# Patient Record
Sex: Female | Born: 1985 | Race: White | Hispanic: No | Marital: Married | Smoking: Current some day smoker
Health system: Southern US, Community
[De-identification: ages and names within clinical notes are randomized; demographics above are authoritative.]

## PROBLEM LIST (undated history)

## (undated) HISTORY — PX: BREAST ENHANCEMENT SURGERY: SHX7

---

## 2008-10-15 ENCOUNTER — Ambulatory Visit (HOSPITAL_COMMUNITY): Admission: RE | Admit: 2008-10-15 | Discharge: 2008-10-15 | Payer: Self-pay | Admitting: Family Medicine

## 2008-12-03 ENCOUNTER — Ambulatory Visit (HOSPITAL_COMMUNITY): Admission: RE | Admit: 2008-12-03 | Discharge: 2008-12-03 | Payer: Self-pay | Admitting: Obstetrics & Gynecology

## 2009-02-18 ENCOUNTER — Ambulatory Visit (HOSPITAL_COMMUNITY): Admission: RE | Admit: 2009-02-18 | Discharge: 2009-02-18 | Payer: Self-pay | Admitting: Family Medicine

## 2009-04-14 ENCOUNTER — Ambulatory Visit (HOSPITAL_COMMUNITY): Admission: RE | Admit: 2009-04-14 | Discharge: 2009-04-14 | Payer: Self-pay | Admitting: Family Medicine

## 2009-04-30 ENCOUNTER — Inpatient Hospital Stay (HOSPITAL_COMMUNITY): Admission: AD | Admit: 2009-04-30 | Discharge: 2009-04-30 | Payer: Self-pay | Admitting: Obstetrics & Gynecology

## 2009-04-30 ENCOUNTER — Ambulatory Visit: Payer: Self-pay | Admitting: Family

## 2009-05-07 ENCOUNTER — Ambulatory Visit (HOSPITAL_COMMUNITY): Admission: RE | Admit: 2009-05-07 | Discharge: 2009-05-07 | Payer: Self-pay | Admitting: Obstetrics & Gynecology

## 2009-05-08 ENCOUNTER — Inpatient Hospital Stay (HOSPITAL_COMMUNITY): Admission: AD | Admit: 2009-05-08 | Discharge: 2009-05-11 | Payer: Self-pay | Admitting: Obstetrics & Gynecology

## 2009-05-08 ENCOUNTER — Ambulatory Visit: Payer: Self-pay | Admitting: Family Medicine

## 2009-05-08 ENCOUNTER — Inpatient Hospital Stay (HOSPITAL_COMMUNITY): Admission: AD | Admit: 2009-05-08 | Discharge: 2009-05-08 | Payer: Self-pay | Admitting: Obstetrics & Gynecology

## 2009-05-13 ENCOUNTER — Ambulatory Visit: Admission: RE | Admit: 2009-05-13 | Discharge: 2009-05-13 | Payer: Self-pay | Admitting: Obstetrics & Gynecology

## 2011-02-07 LAB — CBC
HCT: 25.5 % — ABNORMAL LOW (ref 36.0–46.0)
MCHC: 35 g/dL (ref 30.0–36.0)
MCV: 95.2 fL (ref 78.0–100.0)
Platelets: 168 10*3/uL (ref 150–400)
RBC: 3.43 MIL/uL — ABNORMAL LOW (ref 3.87–5.11)
RDW: 14 % (ref 11.5–15.5)
RDW: 14.3 % (ref 11.5–15.5)

## 2011-02-07 LAB — RPR: RPR Ser Ql: NONREACTIVE

## 2011-10-08 ENCOUNTER — Encounter: Payer: Self-pay | Admitting: Emergency Medicine

## 2011-10-08 ENCOUNTER — Emergency Department (HOSPITAL_COMMUNITY)
Admission: EM | Admit: 2011-10-08 | Discharge: 2011-10-08 | Disposition: A | Discharge status: Home / Self Care | Payer: Self-pay | Attending: Emergency Medicine | Admitting: Emergency Medicine

## 2011-10-08 DIAGNOSIS — H6120 Impacted cerumen, unspecified ear: Secondary | ICD-10-CM | POA: Insufficient documentation

## 2011-10-08 DIAGNOSIS — H9209 Otalgia, unspecified ear: Secondary | ICD-10-CM | POA: Insufficient documentation

## 2011-10-08 NOTE — ED Provider Notes (Signed)
History     CSN: [id number] Arrival date & time: 10/08/2011 10:41 AM   First MD Initiated Contact with Patient 10/08/11 1106      Chief Complaint  Patient presents with  . Ear Fullness    (Consider location/radiation/quality/duration/timing/severity/associated sxs/prior treatment) HPI  25yo female presents to the emergency department complaining of ear fullness, decreased hearing, tinnitus  and pressure.  Pt denies pain, drainage, sinus symptoms, cough, congestion, sore throat, headache, changes in vision, nausea, vomiting, diarrhea, chest pain, abdominal pain or SOB.  Pt attempted to clean her ears with an ear kit last night without resolution.  Pt states she cleans her ears 3-4x/week with q-tips.  Hx of excess cerumen.    History reviewed. No pertinent past medical history.  History reviewed. No pertinent past surgical history.  History reviewed. No pertinent family history.  History  Substance Use Topics  . Smoking status: Current Some Day Smoker  . Smokeless tobacco: Not on file  . Alcohol Use: No    OB History    Grav Para Term Preterm Abortions TAB SAB Ect Mult Living                  Review of Systems All pertinent positives and negatives in the history of present illness Allergies  Review of patient's allergies indicates no known allergies.  Home Medications   Current Outpatient Rx  Name Route Sig Dispense Refill  . EAR WAX REMOVER OT Both Eyes Place 1 drop into both eyes 2 (two) times daily as needed. For ear wax removal       BP 128/77  Pulse 65  Temp(Src) 98.2 F (36.8 C) (Oral)  Resp 14  SpO2 100%  Physical Exam  Constitutional: She is oriented to person, place, and time. She appears well-developed and well-nourished.  HENT:  Head: Normocephalic.  Right Ear: No drainage or tenderness. No mastoid tenderness. Decreased hearing is noted.  Left Ear: No drainage or tenderness. No mastoid tenderness. Decreased hearing is noted.  Nose: Nose normal.    Mouth/Throat: Oropharynx is clear and moist.       Significant cerumen impaction filling the entire canal bilaterally  TM and canal exam normal after ear wash bilaterally  Eyes: Conjunctivae are normal.  Neck: Normal range of motion.  Cardiovascular: Normal rate, regular rhythm and normal heart sounds.   Pulmonary/Chest: Effort normal and breath sounds normal.  Neurological: She is alert and oriented to person, place, and time.  Skin: Skin is warm and dry.  Psychiatric: She has a normal mood and affect. Her behavior is normal. Judgment and thought content normal.    ED Course  Procedures (including critical care time)  Significant ceruminosis is noted bilaterally.  Ears soaked with carbamide peroxide.  Wax removed by syringing and manual debridement. Instructions for home care to prevent wax buildup are given.   I personally irrigated the patients ears and all cerumen was removed and there was no complications and no TM perforation.     MDM  Cerumen Impaction bilaterally.     Carlyle Dolly, PA-C 10/08/11 1323

## 2011-10-08 NOTE — ED Notes (Addendum)
Pt states that she began to have ear fullness, ringing and muffled hearing for the past 3 days.  Says she has tried hot compresses, shower and earwax removal kit without relief.  Pt state she had a cold a month ago.

## 2011-10-08 NOTE — ED Provider Notes (Signed)
Medical screening examination/treatment/procedure(s) were performed by non-physician practitioner and as supervising physician I was immediately available for consultation/collaboration.  Doug Sou, MD 10/08/11 1650

## 2014-10-12 ENCOUNTER — Emergency Department (HOSPITAL_COMMUNITY)
Admission: EM | Admit: 2014-10-12 | Discharge: 2014-10-12 | Disposition: A | Discharge status: Home / Self Care | Payer: Self-pay | Attending: Emergency Medicine | Admitting: Emergency Medicine

## 2014-10-12 ENCOUNTER — Encounter (HOSPITAL_COMMUNITY): Payer: Self-pay | Admitting: Emergency Medicine

## 2014-10-12 DIAGNOSIS — M5442 Lumbago with sciatica, left side: Secondary | ICD-10-CM | POA: Insufficient documentation

## 2014-10-12 DIAGNOSIS — Z720 Tobacco use: Secondary | ICD-10-CM | POA: Insufficient documentation

## 2014-10-12 DIAGNOSIS — Z791 Long term (current) use of non-steroidal anti-inflammatories (NSAID): Secondary | ICD-10-CM | POA: Insufficient documentation

## 2014-10-12 DIAGNOSIS — M5432 Sciatica, left side: Secondary | ICD-10-CM

## 2014-10-12 MED ORDER — KETOROLAC TROMETHAMINE 60 MG/2ML IM SOLN
60.0000 mg | Freq: Once | INTRAMUSCULAR | Status: AC
  Administered 2014-10-12: 60 mg via INTRAMUSCULAR
  Filled 2014-10-12: qty 2

## 2014-10-12 MED ORDER — PREDNISONE 20 MG PO TABS
40.0000 mg | ORAL_TABLET | Freq: Once | ORAL | Status: AC
  Administered 2014-10-12: 40 mg via ORAL
  Filled 2014-10-12: qty 2

## 2014-10-12 MED ORDER — PREDNISONE 20 MG PO TABS: 40.0000 mg | ORAL_TABLET | Freq: Every day | ORAL | Status: AC

## 2014-10-12 MED ORDER — HYDROCODONE-ACETAMINOPHEN 5-325 MG PO TABS: 2.0000 | ORAL_TABLET | ORAL | Status: DC | PRN

## 2014-10-12 MED ORDER — CYCLOBENZAPRINE HCL 10 MG PO TABS: 10.0000 mg | ORAL_TABLET | Freq: Two times a day (BID) | ORAL | Status: DC | PRN

## 2014-10-12 NOTE — ED Notes (Signed)
Pt c/o lower back pain with radiation down left leg x 4 days worse over last two days

## 2014-10-12 NOTE — ED Provider Notes (Signed)
CSN: [id number]     Arrival date & time 10/12/14  1100 History   First MD Initiated Contact with Patient 10/12/14 1107     Chief Complaint  Patient presents with  . Back Pain     (Consider location/radiation/quality/duration/timing/severity/associated sxs/prior Treatment) HPI Comments: Patient is a 28 year old female who presents with gradual onset of lower back pain that started 4 days ago. The pain is sharp and severe and radiates down her right leg. The pain is constant. Movement makes the pain worse. Nothing makes the pain better. Patient has tried OTC anti-inflammatory medication for pain without relief. No associated symptoms. No saddle paresthesias or bladder/bowel incontinence. Patient denies injury.      History reviewed. No pertinent past medical history. History reviewed. No pertinent past surgical history. History reviewed. No pertinent family history. History  Substance Use Topics  . Smoking status: Current Some Day Smoker  . Smokeless tobacco: Not on file  . Alcohol Use: No   OB History    No data available     Review of Systems  Constitutional: Negative for fever, chills and fatigue.  HENT: Negative for trouble swallowing.   Eyes: Negative for visual disturbance.  Respiratory: Negative for shortness of breath.   Cardiovascular: Negative for chest pain and palpitations.  Gastrointestinal: Negative for nausea, vomiting, abdominal pain and diarrhea.  Genitourinary: Negative for dysuria and difficulty urinating.  Musculoskeletal: Positive for back pain. Negative for arthralgias and neck pain.  Skin: Negative for color change.  Neurological: Negative for dizziness and weakness.  Psychiatric/Behavioral: Negative for dysphoric mood.      Allergies  Review of patient's allergies indicates no known allergies.  Home Medications   Prior to Admission medications   Medication Sig Start Date End Date Taking? Authorizing Provider  ibuprofen (ADVIL,MOTRIN) 400 MG  tablet Take 400 mg by mouth every 6 (six) hours as needed for moderate pain.   Yes Historical Provider, MD  naproxen sodium (ANAPROX) 220 MG tablet Take 220 mg by mouth 2 (two) times daily with a meal.   Yes Historical Provider, MD   BP 137/89 mmHg  Pulse 92  Temp(Src) 98.3 F (36.8 C) (Oral)  Resp 18  Ht 5\' 6"  (1.676 m)  Wt 98 lb (44.453 kg)  BMI 15.83 kg/m2  SpO2 100% Physical Exam  Constitutional: She is oriented to person, place, and time. She appears well-developed and well-nourished. No distress.  HENT:  Head: Normocephalic and atraumatic.  Eyes: Conjunctivae and EOM are normal.  Neck: Normal range of motion.  Cardiovascular: Normal rate and regular rhythm.  Exam reveals no gallop and no friction rub.   No murmur heard. Pulmonary/Chest: Effort normal and breath sounds normal. She has no wheezes. She has no rales. She exhibits no tenderness.  Abdominal: Soft. She exhibits no distension. There is no tenderness. There is no rebound.  Musculoskeletal: Normal range of motion.  No midline spine tenderness to palpation. Left buttock tenderness to palpation. Left lumbar paraspinal tenderness to palpation.   Neurological: She is alert and oriented to person, place, and time. Coordination normal.  Speech is goal-oriented. Moves limbs without ataxia.   Skin: Skin is warm and dry.  Psychiatric: She has a normal mood and affect. Her behavior is normal.  Nursing note and vitals reviewed.   ED Course  Procedures (including critical care time) Labs Review Labs Reviewed - No data to display  Imaging Review No results found.   EKG Interpretation None      MDM  Final diagnoses:  Sciatica, left    11:49 AM Patient likely has sciatica. No bladder/bowel incontinence or saddle paresthesias. Vitals stable and patient afebrile.     Emilia BeckKaitlyn Libra Gatz, PA-C 10/12/14 1202  Derwood KaplanAnkit Nanavati, MD 10/13/14 1657

## 2014-10-12 NOTE — Discharge Instructions (Signed)
Take vicodin as needed for pain. Take flexeril as needed for muscle spasm. Take prednisone as directed until gon. You may take these medications together. Refer to attached documents for more information.

## 2014-10-21 ENCOUNTER — Encounter (HOSPITAL_COMMUNITY): Payer: Self-pay | Admitting: Emergency Medicine

## 2014-10-21 ENCOUNTER — Emergency Department (HOSPITAL_COMMUNITY)
Admission: EM | Admit: 2014-10-21 | Discharge: 2014-10-21 | Disposition: A | Discharge status: Home / Self Care | Payer: Self-pay | Attending: Emergency Medicine | Admitting: Emergency Medicine

## 2014-10-21 DIAGNOSIS — M5442 Lumbago with sciatica, left side: Secondary | ICD-10-CM | POA: Insufficient documentation

## 2014-10-21 DIAGNOSIS — Z791 Long term (current) use of non-steroidal anti-inflammatories (NSAID): Secondary | ICD-10-CM | POA: Insufficient documentation

## 2014-10-21 DIAGNOSIS — Z79899 Other long term (current) drug therapy: Secondary | ICD-10-CM | POA: Insufficient documentation

## 2014-10-21 DIAGNOSIS — Z7952 Long term (current) use of systemic steroids: Secondary | ICD-10-CM | POA: Insufficient documentation

## 2014-10-21 DIAGNOSIS — M62830 Muscle spasm of back: Secondary | ICD-10-CM

## 2014-10-21 DIAGNOSIS — M5432 Sciatica, left side: Secondary | ICD-10-CM

## 2014-10-21 DIAGNOSIS — R52 Pain, unspecified: Secondary | ICD-10-CM

## 2014-10-21 DIAGNOSIS — Z720 Tobacco use: Secondary | ICD-10-CM | POA: Insufficient documentation

## 2014-10-21 MED ORDER — OXYCODONE-ACETAMINOPHEN 5-325 MG PO TABS
1.0000 | ORAL_TABLET | Freq: Once | ORAL | Status: AC
  Administered 2014-10-21: 1 via ORAL
  Filled 2014-10-21: qty 1

## 2014-10-21 MED ORDER — KETOROLAC TROMETHAMINE 60 MG/2ML IM SOLN
60.0000 mg | Freq: Once | INTRAMUSCULAR | Status: AC
  Administered 2014-10-21: 60 mg via INTRAMUSCULAR
  Filled 2014-10-21: qty 2

## 2014-10-21 MED ORDER — CYCLOBENZAPRINE HCL 10 MG PO TABS: 10.0000 mg | ORAL_TABLET | Freq: Two times a day (BID) | ORAL | Status: AC | PRN

## 2014-10-21 MED ORDER — OXYCODONE-ACETAMINOPHEN 5-325 MG PO TABS: 1.0000 | ORAL_TABLET | ORAL | Status: AC | PRN

## 2014-10-21 NOTE — ED Notes (Signed)
PT refuses x-ray at this time and is requested to go home. EShelva Majestic. West PA notified that Pt is going home.

## 2014-10-21 NOTE — ED Notes (Signed)
Pt was seen 12/14 for LBP with radiation to left leg. States is out of Vicodin.

## 2014-10-21 NOTE — ED Provider Notes (Signed)
CSN: [id number]     Arrival date & time 10/21/14  1347 History  This chart was scribed for non-physician practitioner, Kelli BeckKaitlyn Nam Vossler, PA-C working with Kelli RudeNathan R. Rubin PayorPickering, MD by Kelli Gardner, ED Scribe. This patient was seen in room TR06C/TR06C and the patient's care was started at 3:29 PM.     Chief Complaint  Patient presents with  . Back Pain      Patient is a 28 y.o. female presenting with back pain. The history is provided by the patient. No language interpreter was used.  Back Pain Location:  Lumbar spine Radiates to:  L posterior upper leg Pain severity:  Moderate Progression:  Worsening Relieved by:  Narcotics Associated symptoms: leg pain   Associated symptoms: no bladder incontinence, no bowel incontinence, no fever and no headaches      HPI Comments:  Kelli Gardner is a 28 y.o. female who presents to the Emergency Department complaining of moderate lower back pain that worsened this am. She states her pain radiates down her LLE and reports a spasming sensation down her LLE. Pt was  evaluated for same pain about 2 weeks ago, diagnosed with sciatica and prescribed vicodin which she had been taking with moderate relief until she ran out a few days ago.She denies acute injury/fall.    History reviewed. No pertinent past medical history. Past Surgical History  Procedure Laterality Date  . Breast enhancement surgery     No family history on file. History  Substance Use Topics  . Smoking status: Current Some Day Smoker  . Smokeless tobacco: Not on file  . Alcohol Use: No   OB History    No data available     Review of Systems  Constitutional: Negative for fever.  Gastrointestinal: Negative for bowel incontinence.  Genitourinary: Negative for bladder incontinence.  Musculoskeletal: Positive for myalgias and back pain.  Neurological: Negative for headaches.  All other systems reviewed and are negative.     Allergies  Review of patient's allergies  indicates no known allergies.  Home Medications   Prior to Admission medications   Medication Sig Start Date End Date Taking? Authorizing Provider  cyclobenzaprine (FLEXERIL) 10 MG tablet Take 1 tablet (10 mg total) by mouth 2 (two) times daily as needed for muscle spasms. 10/12/14   Kelli BeckKaitlyn Kelli Vandergriff, PA-C  HYDROcodone-acetaminophen (NORCO/VICODIN) 5-325 MG per tablet Take 2 tablets by mouth every 4 (four) hours as needed for moderate pain or severe pain. 10/12/14   Kelli Florek, PA-C  ibuprofen (ADVIL,MOTRIN) 400 MG tablet Take 400 mg by mouth every 6 (six) hours as needed for moderate pain.    Historical Provider, MD  naproxen sodium (ANAPROX) 220 MG tablet Take 220 mg by mouth 2 (two) times daily with a meal.    Historical Provider, MD  predniSONE (DELTASONE) 20 MG tablet Take 2 tablets (40 mg total) by mouth daily. Take 40 mg by mouth daily for 3 days, then 20mg  by mouth daily for 3 days, then 10mg  daily for 3 days 10/12/14   Kelli BeckKaitlyn Kelli Jowers, PA-C   BP 117/82 mmHg  Pulse 90  Temp(Src) 98.3 F (36.8 C) (Oral)  Resp 18  Ht 5\' 6"  (1.676 m)  Wt 102 lb (46.267 kg)  BMI 16.47 kg/m2  SpO2 100%  LMP 10/07/2014 Physical Exam  Constitutional: She is oriented to person, place, and time. She appears well-developed and well-nourished.  HENT:  Head: Normocephalic and atraumatic.  Eyes: Conjunctivae and EOM are normal. Pupils are equal, round, and reactive to light.  Neck: Normal range of motion. Neck supple.  Cardiovascular: Normal rate, regular rhythm and normal heart sounds.   Pulmonary/Chest: Effort normal and breath sounds normal.  Abdominal: Soft. Bowel sounds are normal. She exhibits no distension.  Musculoskeletal: Normal range of motion.  No midline spine TTP Left gluteal TTP Left hip ROM due to pain   Neurological: She is alert and oriented to person, place, and time.  Lower extremity strength and sensation equal and intact bilaterally   Skin: Skin is warm and dry.   Psychiatric: She has a normal mood and affect. Her behavior is normal.  Nursing note and vitals reviewed.   ED Course  Procedures   DIAGNOSTIC STUDIES:  Oxygen Saturation is 100% on RA, normal by my interpretation.    COORDINATION OF CARE:  3:37 PM Discussed treatment plan with pt at bedside and pt agreed to plan.  Labs Review Labs Reviewed - No data to display  Imaging Review No results found.   EKG Interpretation None      MDM   Final diagnoses:  Sciatica neuralgia, left  Muscle spasm of back    Patient having severe muscle spasms likely due to sciatica. Patient given IM toradol here and flexeril. I offered xrays but patient declined. Patient denies injury. No bladder/bowel incontinence or saddle paresthesias. Patient will have Percocet and Flexeril for pain. [facility] Duty[facility]icia [facility].   I personally performed the services described in this documentation, which was scribed in my presence. The recorded information has been reviewed and is accurate.    Kelli BeckKaitlyn Narda Fundora, PA-C 10/22/14 0802  Kelli RudeNathan R. Rubin PayorPickering, MD 10/22/14 1549

## 2014-10-21 NOTE — ED Notes (Signed)
Pt is now refusing the Xray at this time and wishes to be discharged as originally planned.  PA made aware.

## 2014-10-21 NOTE — Discharge Planning (Signed)
Monice Lundy J. Clydene Laming, RN, [place], [place] [phone] ED CM consulted to meet with patient concerning f/u care with PCP and patient does not have insurance. Pt presented to [facility] ED today with sciatic pain.  Met with patient at bedside, confirmed informaton. Pt  reports not having access to f/u care with PCP, or insurance coverage. Discussed with patient importance and benefits of  establishing PCP, and not utilizing the ED for primary care needs. Pt verbalized understanding and is in agreement. Discussed other options, provided list of local  affordable PCPs.  Pt voiced  Interest in the [facility] and [place].  [facility] Brochure given with address, phone number, and the services highlighted. Explained that there is a Customer service manager on site who will assist with [employer] and process. Instructed to call or walk in Monday - Friday between the hours of 9- 5:30p to schedule appt for establishing care for PCP. Pt verbalized understanding.

## 2014-10-21 NOTE — Discharge Instructions (Signed)
Take percocet for pain. Take Flexeril for muscle spasm. Apply heat to the affected area for relief of muscle spasm. Refer to attached documents for more information.

## 2019-06-10 ENCOUNTER — Encounter (HOSPITAL_COMMUNITY): Payer: Self-pay

## 2019-06-10 ENCOUNTER — Emergency Department (HOSPITAL_COMMUNITY)
Admission: EM | Admit: 2019-06-10 | Discharge: 2019-06-10 | Disposition: A | Discharge status: Home / Self Care | Payer: Medicaid Other | Attending: Emergency Medicine | Admitting: Emergency Medicine

## 2019-06-10 ENCOUNTER — Other Ambulatory Visit: Payer: Self-pay

## 2019-06-10 DIAGNOSIS — F17200 Nicotine dependence, unspecified, uncomplicated: Secondary | ICD-10-CM | POA: Insufficient documentation

## 2019-06-10 DIAGNOSIS — R600 Localized edema: Secondary | ICD-10-CM | POA: Diagnosis not present

## 2019-06-10 DIAGNOSIS — R220 Localized swelling, mass and lump, head: Secondary | ICD-10-CM

## 2019-06-10 DIAGNOSIS — Z79899 Other long term (current) drug therapy: Secondary | ICD-10-CM | POA: Insufficient documentation

## 2019-06-10 DIAGNOSIS — K047 Periapical abscess without sinus: Secondary | ICD-10-CM | POA: Insufficient documentation

## 2019-06-10 MED ORDER — HYDROCODONE-ACETAMINOPHEN 5-325 MG PO TABS
1.0000 | ORAL_TABLET | Freq: Once | ORAL | Status: AC
  Administered 2019-06-10: 1 via ORAL
  Filled 2019-06-10: qty 1

## 2019-06-10 MED ORDER — CLINDAMYCIN HCL 300 MG PO CAPS: 300.0000 mg | ORAL_CAPSULE | Freq: Four times a day (QID) | ORAL | 0 refills | Status: AC

## 2019-06-10 MED ORDER — HYDROCODONE-ACETAMINOPHEN 5-325 MG PO TABS: 1.0000 | ORAL_TABLET | ORAL | 0 refills | Status: AC | PRN

## 2019-06-10 MED ORDER — CLINDAMYCIN HCL 150 MG PO CAPS
300.0000 mg | ORAL_CAPSULE | Freq: Once | ORAL | Status: AC
  Administered 2019-06-10: 13:00:00 300 mg via ORAL
  Filled 2019-06-10: qty 2

## 2019-06-10 NOTE — Discharge Instructions (Signed)
Schedule dental evaluation  °

## 2019-06-10 NOTE — ED Triage Notes (Signed)
Patient complains of 2 days of left jaw swelling, unsure if she has broken tooth. Icing on arrival, states that the pain is increasing

## 2019-06-11 NOTE — ED Provider Notes (Signed)
[facility] EMERGENCY DEPARTMENT Provider Note   CSN: [id number] Arrival date & time: 06/10/19  1015     History   Chief Complaint Chief Complaint  Patient presents with  . Facial Swelling    HPI Kelli Gardner is a 33 y.o. female.     Pt complains of swelling left lower jaw.    The history is provided by the patient. No language interpreter was used.  Dental Pain Quality:  Aching Severity:  Moderate Onset quality:  Gradual Timing:  Constant Progression:  Worsening Chronicity:  New Relieved by:  Nothing Worsened by:  Nothing Ineffective treatments:  None tried Associated symptoms: no fever   Risk factors: periodontal disease     History reviewed. No pertinent past medical history.  There are no active problems to display for this patient.   Past Surgical History:  Procedure Laterality Date  . BREAST ENHANCEMENT SURGERY       OB History   No obstetric history on file.      Home Medications    Prior to Admission medications   Medication Sig Start Date End Date Taking? Authorizing Provider  clindamycin (CLEOCIN) 300 MG capsule Take 1 capsule (300 mg total) by mouth 4 (four) times daily for 10 days. 06/10/19 06/20/19  Fransico Meadow, PA-C  cyclobenzaprine (FLEXERIL) 10 MG tablet Take 1 tablet (10 mg total) by mouth 2 (two) times daily as needed for muscle spasms. 10/21/14   Alvina Chou, PA-C  HYDROcodone-acetaminophen (NORCO/VICODIN) 5-325 MG tablet Take 1 tablet by mouth every 4 (four) hours as needed. 06/10/19   Fransico Meadow, PA-C  ibuprofen (ADVIL,MOTRIN) 400 MG tablet Take 400 mg by mouth every 6 (six) hours as needed for moderate pain.    [provider]  naproxen sodium (ANAPROX) 220 MG tablet Take 220 mg by mouth 2 (two) times daily with a meal.    [provider]  oxyCODONE-acetaminophen (PERCOCET/ROXICET) 5-325 MG per tablet Take 1-2 tablets by mouth every 4 (four) hours as needed for moderate pain or severe  pain. 10/21/14   Szekalski, Verline Lema, PA-C  predniSONE (DELTASONE) 20 MG tablet Take 2 tablets (40 mg total) by mouth daily. Take 40 mg by mouth daily for 3 days, then 20mg  by mouth daily for 3 days, then 10mg  daily for 3 days 10/12/14   Alvina Chou, PA-C    Family History No family history on file.  Social History Social History   Tobacco Use  . Smoking status: Current Some Day Smoker  Substance Use Topics  . Alcohol use: No  . Drug use: No     Allergies   Patient has no known allergies.   Review of Systems Review of Systems  Constitutional: Negative for fever.  HENT: Positive for dental problem.   All other systems reviewed and are negative.    Physical Exam Updated Vital Signs BP (!) 135/100 (BP Location: Right Arm)   Pulse 89   Temp 100.2 F (37.9 C) (Oral)   Resp 14   SpO2 100%   Physical Exam Vitals signs and nursing note reviewed.  Constitutional:      Appearance: She is well-developed.  HENT:     Head: Normocephalic.     Mouth/Throat:     Comments: Swelling face,  Neck:     Musculoskeletal: Normal range of motion.  Cardiovascular:     Rate and Rhythm: Normal rate.  Pulmonary:     Effort: Pulmonary effort is normal.  Abdominal:  General: There is no distension.  Musculoskeletal: Normal range of motion.  Skin:    General: Skin is warm.  Neurological:     General: No focal deficit present.     Mental Status: She is alert and oriented to person, place, and time.  Psychiatric:        Mood and Affect: Mood normal.      ED Treatments / Results  Labs (all labs ordered are listed, but only abnormal results are displayed) Labs Reviewed - No data to display  EKG None  Radiology No results found.  Procedures Procedures (including critical care time)  Medications Ordered in ED Medications  HYDROcodone-acetaminophen (NORCO/VICODIN) 5-325 MG per tablet 1 tablet (1 tablet Oral Given 06/10/19 1238)  clindamycin (CLEOCIN) capsule 300 mg  (300 mg Oral Given 06/10/19 1238)     Initial Impression / Assessment and Plan / ED Course  I have reviewed the triage vital signs and the nursing notes.  Pertinent labs & imaging results that were available during my care of the patient were reviewed by me and considered in my medical decision making (see chart for details).        MDM  Pt given rx for clindamycin and hydrocodone   Final Clinical Impressions(s) / ED Diagnoses   Final diagnoses:  Facial swelling  Dental abscess    ED Discharge Orders         Ordered    clindamycin (CLEOCIN) 300 MG capsule  4 times daily     06/10/19 1202    HYDROcodone-acetaminophen (NORCO/VICODIN) 5-325 MG tablet  Every 4 hours PRN     06/10/19 1202        An After Visit Summary was printed and given to the patient.    Elson AreasSofia, Leslie K, PA-C 06/11/19 1308    Gwyneth SproutPlunkett, Whitney, MD 06/13/19 [phone]

## 2021-01-12 ENCOUNTER — Other Ambulatory Visit: Payer: Self-pay

## 2021-01-12 ENCOUNTER — Encounter (HOSPITAL_COMMUNITY): Payer: Self-pay | Admitting: Emergency Medicine

## 2021-01-12 ENCOUNTER — Emergency Department (HOSPITAL_COMMUNITY): Payer: Medicaid Other

## 2021-01-12 ENCOUNTER — Emergency Department (HOSPITAL_COMMUNITY)
Admission: EM | Admit: 2021-01-12 | Discharge: 2021-01-12 | Disposition: A | Discharge status: Home / Self Care | Payer: Medicaid Other | Attending: Emergency Medicine | Admitting: Emergency Medicine

## 2021-01-12 DIAGNOSIS — F17200 Nicotine dependence, unspecified, uncomplicated: Secondary | ICD-10-CM | POA: Diagnosis not present

## 2021-01-12 DIAGNOSIS — M25521 Pain in right elbow: Secondary | ICD-10-CM | POA: Diagnosis not present

## 2021-01-12 MED ORDER — NAPROXEN 500 MG PO TABS: 500.0000 mg | ORAL_TABLET | Freq: Two times a day (BID) | ORAL | 0 refills | Status: AC

## 2021-01-12 NOTE — ED Triage Notes (Signed)
Patient coming from home. Complaint of right elbow pain x2 weeks. Patient states it feels very tight and painful upon movement.

## 2021-01-12 NOTE — Discharge Instructions (Signed)
You may alternate ibuprofen and Tylenol.  You may also apply ice or heat to the area.  Please continue to rest the arm, you may follow-up with sports medicine as needed.

## 2021-01-12 NOTE — ED Provider Notes (Addendum)
[facility] EMERGENCY DEPARTMENT Provider Note   CSN: [id number] Arrival date & time: 01/12/21  1031     History Chief Complaint  Patient presents with  . Arm Pain    Kelli Gardner is a 35 y.o. female.  2 female with no past medical history presents to the ED with a chief complaint of right elbow pain x3 weeks.  Patient describes this pain as shooting, sharp exacerbated with flexion along with extension, improved by holding the right elbow.  She is attempted Aleve, ibuprofen, tennis elbow brace with mild improvement in symptoms.  She does report being very clumsy, states that she likely hit her elbow several times against a door on accident.  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  Does not have any past history of IV drug use, fever, chest pain, other complaints.  The history is provided by the patient.  Arm Pain This is a new problem. The current episode started more than 1 week ago. The problem occurs constantly. The problem has been gradually worsening. Pertinent negatives include no chest pain, no abdominal pain, no headaches and no shortness of breath. She has tried a cold compress, rest, acetaminophen and a warm compress for the symptoms.       History reviewed. No pertinent past medical history.  There are no problems to display for this patient.   Past Surgical History:  Procedure Laterality Date  . BREAST ENHANCEMENT SURGERY       OB History   No obstetric history on file.     No family history on file.  Social History   Tobacco Use  . Smoking status: Current Some Day Smoker  Substance Use Topics  . Alcohol use: No  . Drug use: No    Home Medications Prior to Admission medications   Medication Sig Start Date End Date Taking? Authorizing Provider  cyclobenzaprine (FLEXERIL) 10 MG tablet Take 1 tablet (10 mg total) by mouth 2 (two) times daily as needed for muscle spasms. 10/21/14    Emilia Beck, PA-C  HYDROcodone-acetaminophen (NORCO/VICODIN) 5-325 MG tablet Take 1 tablet by mouth every 4 (four) hours as needed. 06/10/19   Elson Areas, PA-C  ibuprofen (ADVIL,MOTRIN) 400 MG tablet Take 400 mg by mouth every 6 (six) hours as needed for moderate pain.    [provider]  naproxen sodium (ANAPROX) 220 MG tablet Take 220 mg by mouth 2 (two) times daily with a meal.    [provider]  oxyCODONE-acetaminophen (PERCOCET/ROXICET) 5-325 MG per tablet Take 1-2 tablets by mouth every 4 (four) hours as needed for moderate pain or severe pain. 10/21/14   Szekalski, Yvonna Alanis, PA-C  predniSONE (DELTASONE) 20 MG tablet Take 2 tablets (40 mg total) by mouth daily. Take 40 mg by mouth daily for 3 days, then 20mg  by mouth daily for 3 days, then 10mg  daily for 3 days 10/12/14   , PA-C    Allergies    Patient has no known allergies.  Review of Systems   Review of Systems  Constitutional: Negative for fever.  Respiratory: Negative for shortness of breath.   Cardiovascular: Negative for chest pain.  Gastrointestinal: Negative for abdominal pain.  Musculoskeletal: Positive for arthralgias.  Neurological: Negative for headaches.    Physical Exam Updated Vital Signs BP (!) 131/96 (BP Location: Left Arm)   Pulse 62   Temp 98.4 F (36.9 C)   Resp 16   SpO2  100%   Physical Exam Vitals and nursing note reviewed.  Constitutional:      Appearance: Normal appearance.  HENT:     Head: Normocephalic and atraumatic.     Nose: Nose normal.     Mouth/Throat:     Mouth: Mucous membranes are moist.  Cardiovascular:     Rate and Rhythm: Normal rate.     Pulses:          Radial pulses are 2+ on the right side and 2+ on the left side.  Pulmonary:     Effort: Pulmonary effort is normal.  Abdominal:     General: Abdomen is flat.  Musculoskeletal:     Right elbow: No swelling, deformity, effusion or lacerations. Normal range of motion. Tenderness  present in lateral epicondyle.     Cervical back: Normal range of motion and neck supple.     Comments: Pain with palpation of the lateral epicondyles, full range of motion with pain.  No swelling or effusion noted.  Pulses are symmetric, capillary refill is intact.  Full range of motion such as extension and flexion of the right elbow.  Skin:    General: Skin is warm and dry.  Neurological:     Mental Status: She is alert and oriented to person, place, and time.     ED Results / Procedures / Treatments   Labs (all labs ordered are listed, but only abnormal results are displayed) Labs Reviewed - No data to display  EKG None  Radiology DG Elbow Complete Right  Result Date: 01/12/2021 CLINICAL DATA:  Right lateral elbow pain for 2 weeks. No trauma history submitted. EXAM: RIGHT ELBOW - COMPLETE 3+ VIEW COMPARISON:  None. FINDINGS: No acute fracture or dislocation.  No joint effusion. IMPRESSION: No acute osseous abnormality. Electronically Signed   By: Jeronimo Greaves M.D.   On: 01/12/2021 12:20    Procedures .Splint Application  Date/Time: 01/12/2021 12:53 PM Performed by: Claude Manges, PA-C Authorized by: Claude Manges, PA-C   Consent:    Consent obtained:  Verbal   Consent given by:  Patient   Risks, benefits, and alternatives were discussed: yes     Risks discussed:  Numbness, discoloration, pain and swelling Procedure details:    Location:  Elbow   Elbow location:  R elbow   Strapping: no     Upper extremity splint type: ace wrap. Post-procedure details:    Distal neurologic exam:  Normal   Distal perfusion: distal pulses strong and brisk capillary refill     Procedure completion:  Tolerated well, no immediate complications     Medications Ordered in ED Medications - No data to display  ED Course  I have reviewed the triage vital signs and the nursing notes.  Pertinent labs & imaging results that were available during my care of the patient were reviewed by me and  considered in my medical decision making (see chart for details).    MDM Rules/Calculators/A&P     Patient with no pertinent past medical history presents to the ED with a chief complaint of right elbow pain for the past 3 weeks.  No IV drug use, no fever, no falls, no chest pain or shortness of breath.  During evaluation there is no effusion noted, no laceration, no erythema, no signs consistent with cellulitis.  There is pain with movement, does report several times hitting her elbow on accident, will obtain x-ray to rule out any fracture.  Does have good capillary refill, does have good hand  strength.  Exacerbated with flexion and extension.  There is some pain with rotation at the wrist.  No wrist involvement.  Vitals are within normal limits, she is afebrile.  Xray of her right elbow showed no acute fracture.  These results were discussed with my attending with patient, who is agreeable of discharge home.  Return precautions discussed at length.   Portions of this note were generated with Scientist, clinical (histocompatibility and immunogenetics). Dictation errors may occur despite best attempts at proofreading.  Final Clinical Impression(s) / ED Diagnoses Final diagnoses:  Right elbow pain    Rx / DC Orders ED Discharge Orders    None       Claude Manges, PA-C 01/12/21 1252    Claude Manges, PA-C 01/12/21 1253    Cheryll Cockayne, MD 01/14/21 [phone]

## 2021-01-12 NOTE — ED Notes (Signed)
Pt now requesting pain med rx for home. Awaiting orders from PA

## 2021-01-12 NOTE — ED Notes (Signed)
No changes from triage assessment. Able to drive and use right arm and hand though states it is painful.  No distress

## 2021-09-03 IMAGING — CR DG ELBOW COMPLETE 3+V*R*
4 series · 4 of 4 positions shown · non-contrast
Comparison: None.

CLINICAL DATA: Right lateral elbow pain for 2 weeks. No trauma
history submitted.

EXAM:
RIGHT ELBOW - COMPLETE 3+ VIEW

[elbow ap]
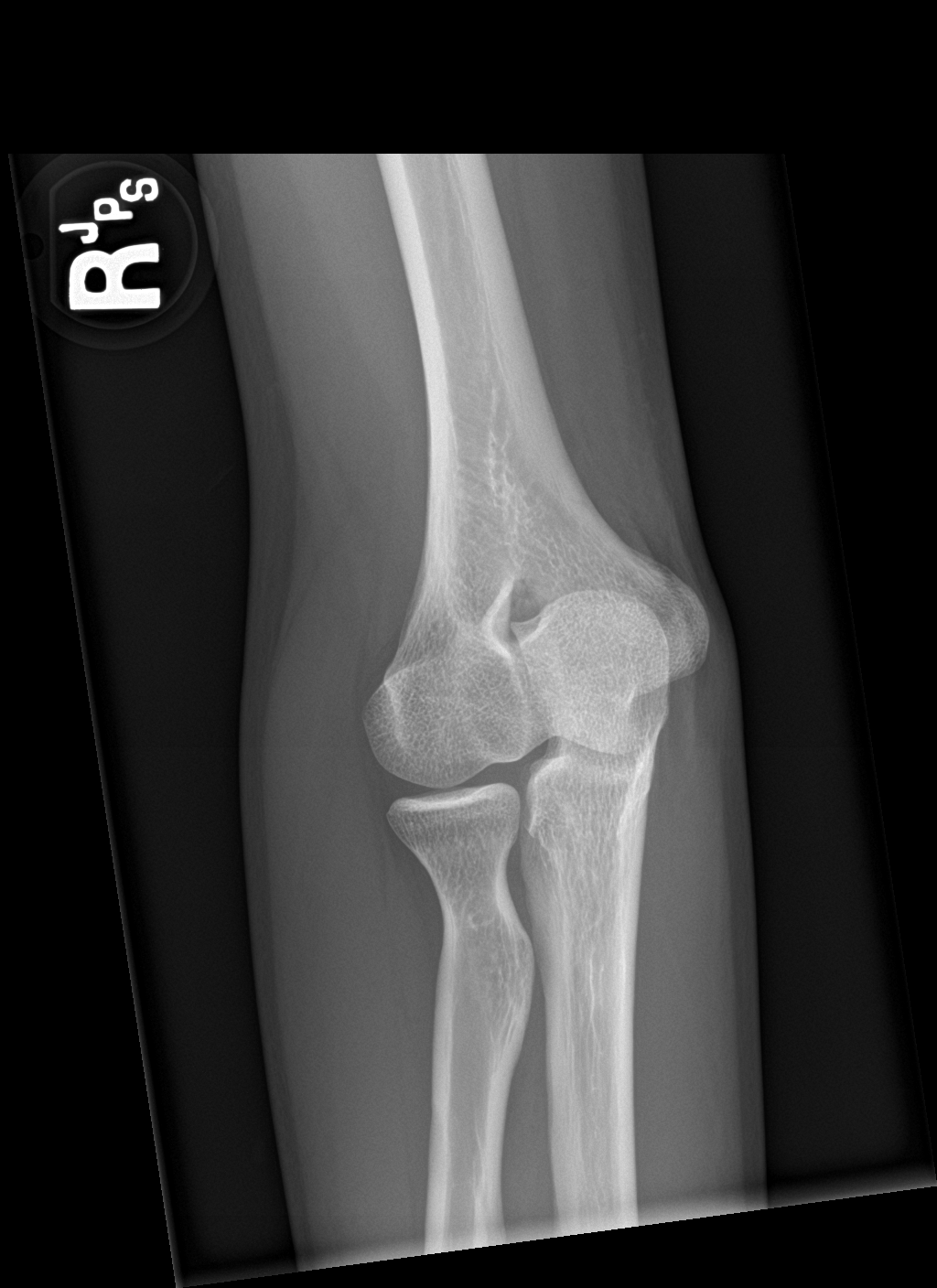

[elbow obl (1 of 2)]
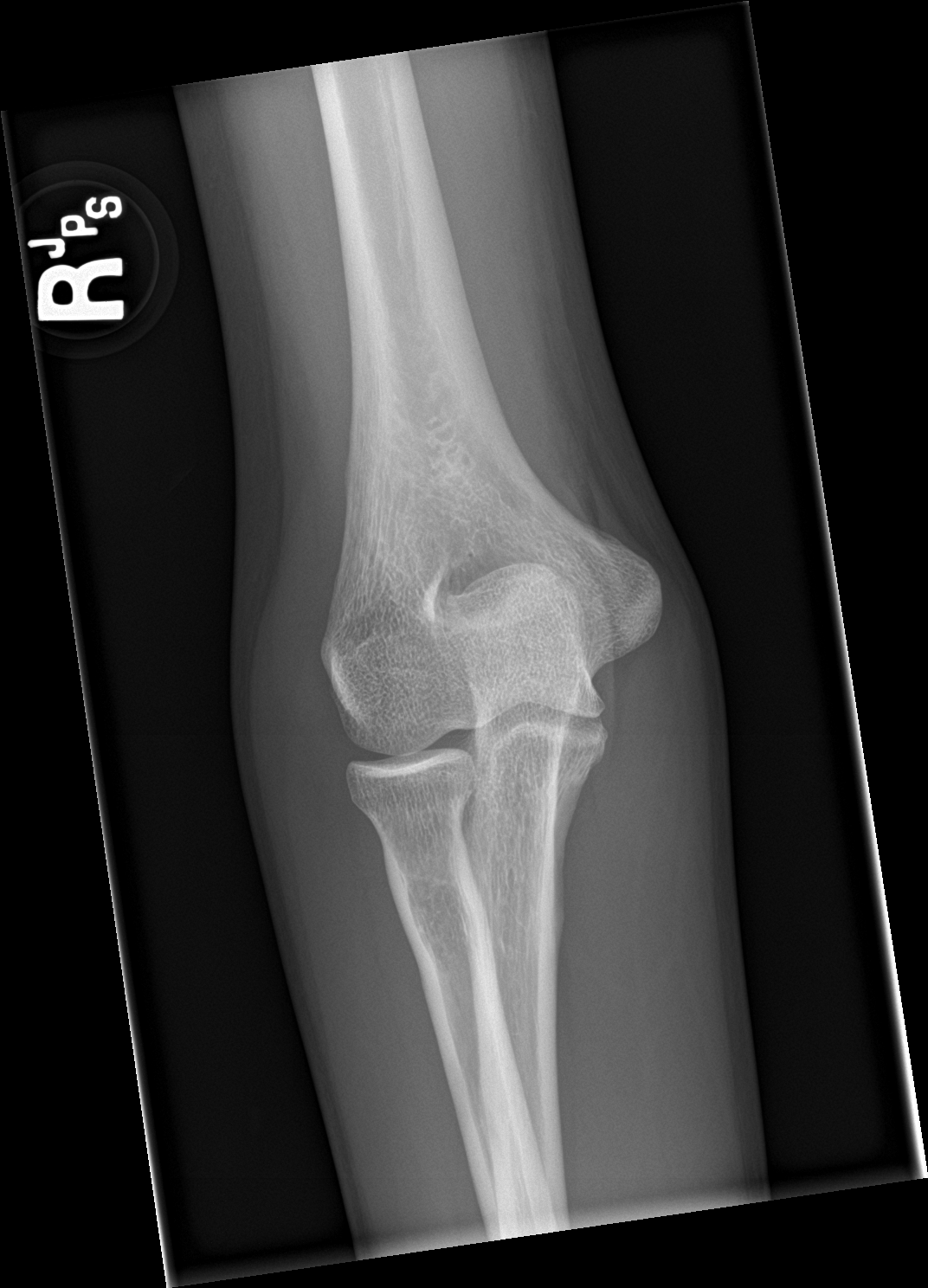

[elbow obl (2 of 2)]
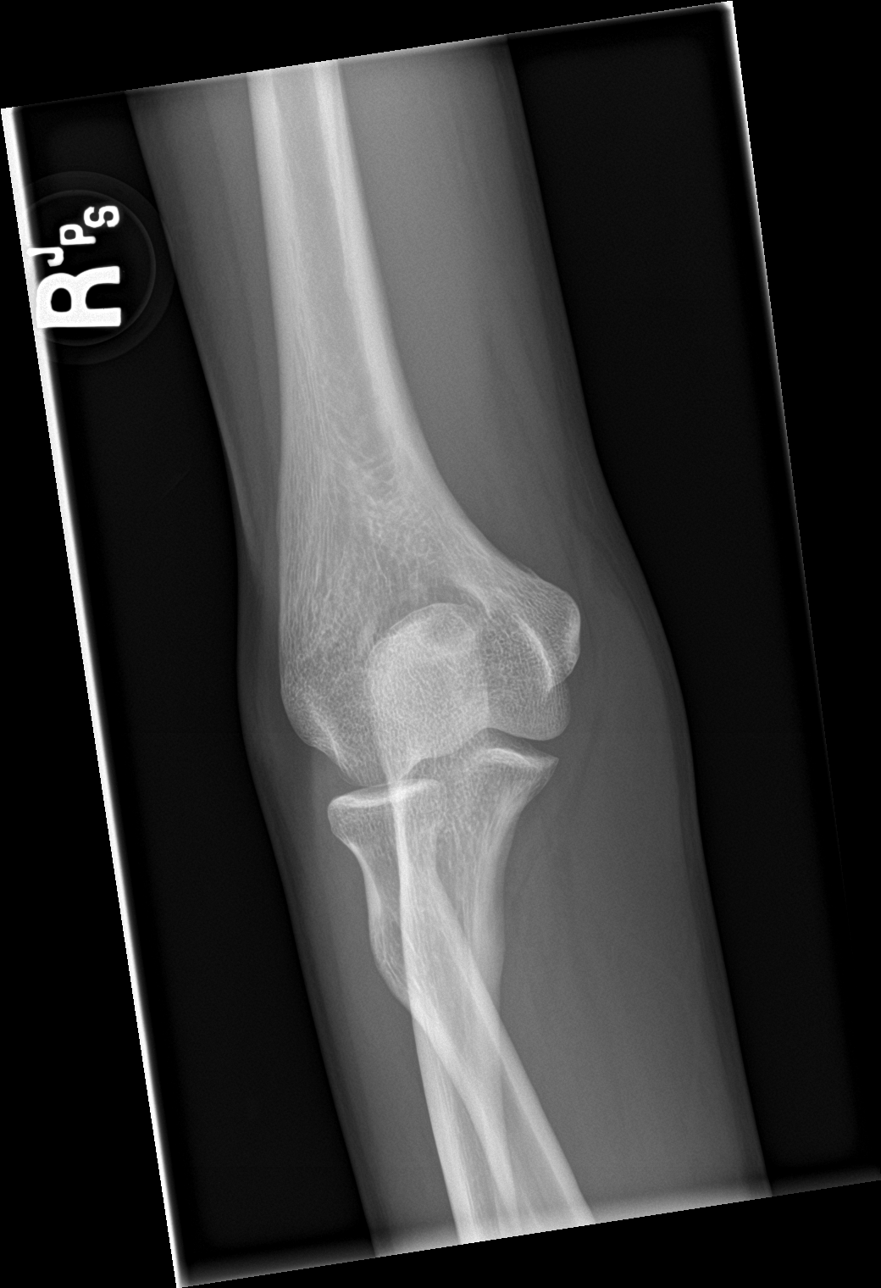

[elbow lat]
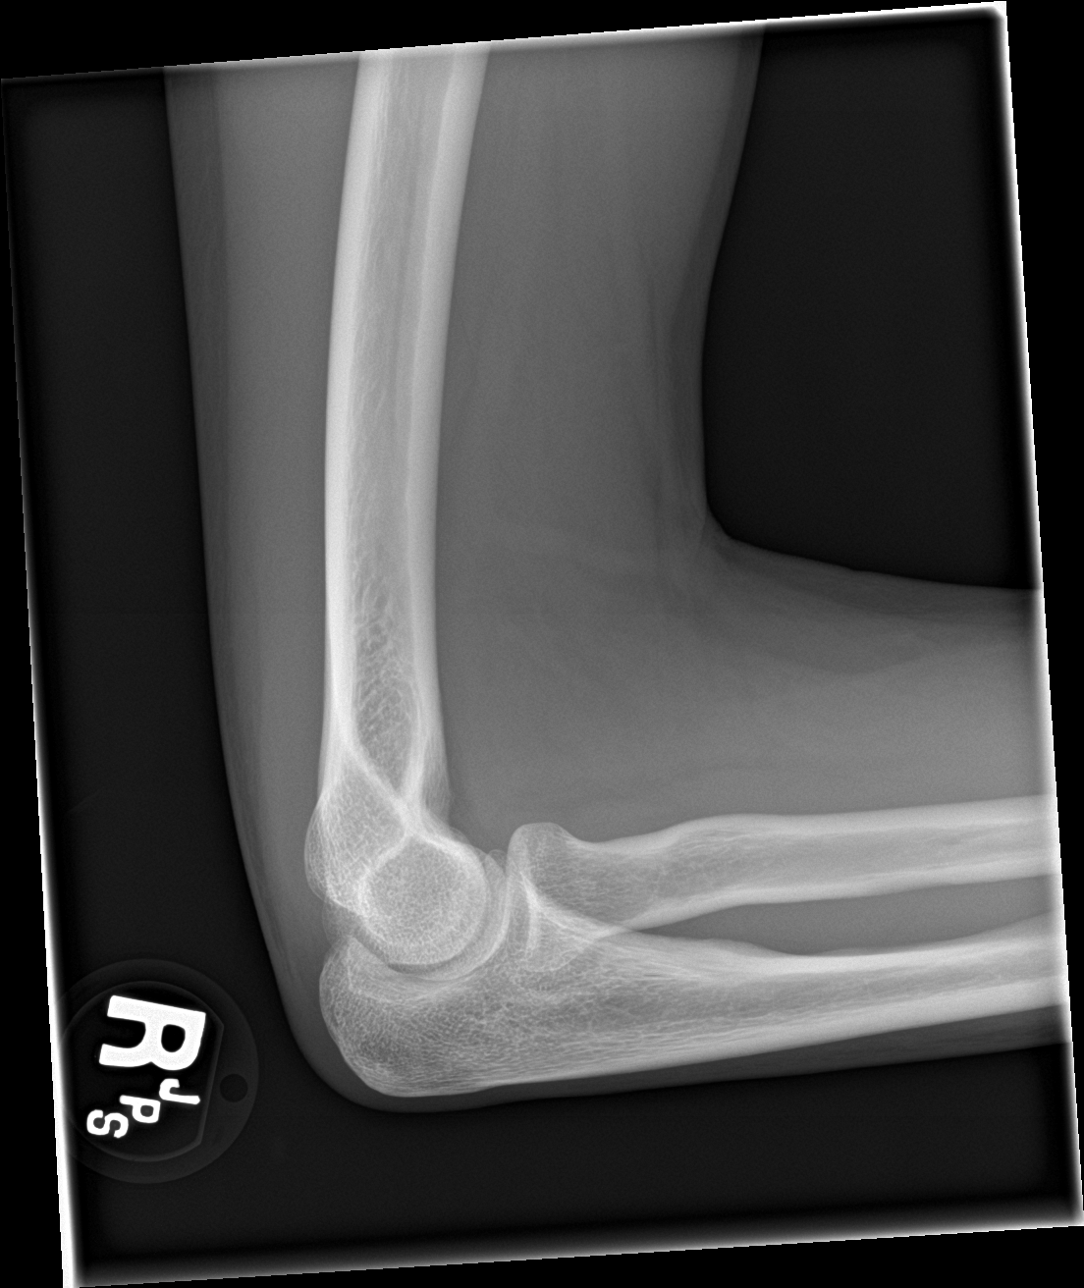

[4 of 4 positions shown; findings below may reference images not displayed]

FINDINGS: No acute fracture or dislocation.  No joint effusion.
IMPRESSION: No acute osseous abnormality.
